# Patient Record
Sex: Female | Born: 1965 | Race: Black or African American | Hispanic: No | Marital: Single | Smoking: Never smoker
Health system: Southern US, Community
[De-identification: ages and names within clinical notes are randomized; demographics above are authoritative.]

## PROBLEM LIST (undated history)

## (undated) DIAGNOSIS — I10 Essential (primary) hypertension: Secondary | ICD-10-CM

---

## 2008-03-03 ENCOUNTER — Emergency Department (HOSPITAL_BASED_OUTPATIENT_CLINIC_OR_DEPARTMENT_OTHER): Admission: EM | Admit: 2008-03-03 | Discharge: 2008-03-03 | Payer: Self-pay | Admitting: Emergency Medicine

## 2008-04-27 ENCOUNTER — Ambulatory Visit (HOSPITAL_COMMUNITY): Admission: RE | Admit: 2008-04-27 | Discharge: 2008-04-27 | Payer: Self-pay | Admitting: Obstetrics and Gynecology

## 2008-05-02 ENCOUNTER — Encounter: Admission: RE | Admit: 2008-05-02 | Discharge: 2008-05-02 | Payer: Self-pay | Admitting: Obstetrics & Gynecology

## 2008-05-02 ENCOUNTER — Ambulatory Visit: Payer: Self-pay | Admitting: Obstetrics & Gynecology

## 2008-05-09 ENCOUNTER — Ambulatory Visit: Payer: Self-pay | Admitting: Obstetrics & Gynecology

## 2008-05-23 ENCOUNTER — Ambulatory Visit: Payer: Self-pay | Admitting: Obstetrics & Gynecology

## 2008-06-02 ENCOUNTER — Ambulatory Visit (HOSPITAL_COMMUNITY): Admission: RE | Admit: 2008-06-02 | Discharge: 2008-06-02 | Payer: Self-pay | Admitting: Family Medicine

## 2008-06-06 ENCOUNTER — Encounter: Payer: Self-pay | Admitting: Obstetrics & Gynecology

## 2008-06-06 ENCOUNTER — Ambulatory Visit: Payer: Self-pay | Admitting: Family Medicine

## 2008-06-20 ENCOUNTER — Ambulatory Visit: Payer: Self-pay | Admitting: Obstetrics & Gynecology

## 2008-07-04 ENCOUNTER — Ambulatory Visit: Payer: Self-pay | Admitting: Obstetrics & Gynecology

## 2008-07-18 ENCOUNTER — Ambulatory Visit: Payer: Self-pay | Admitting: Family Medicine

## 2008-07-19 ENCOUNTER — Ambulatory Visit (HOSPITAL_COMMUNITY): Admission: RE | Admit: 2008-07-19 | Discharge: 2008-07-19 | Payer: Self-pay | Admitting: Family Medicine

## 2008-08-01 ENCOUNTER — Ambulatory Visit: Payer: Self-pay | Admitting: Family Medicine

## 2008-08-01 ENCOUNTER — Encounter: Payer: Self-pay | Admitting: Obstetrics & Gynecology

## 2008-08-01 LAB — CONVERTED CEMR LAB
Antibody Screen: NEGATIVE
HCT: 35.8 % — ABNORMAL LOW (ref 36.0–46.0)
MCV: 88 fL (ref 78.0–100.0)
Platelets: 323 10*3/uL (ref 150–400)
RBC: 4.07 M/uL (ref 3.87–5.11)

## 2008-08-08 ENCOUNTER — Encounter: Payer: Self-pay | Admitting: Obstetrics & Gynecology

## 2008-08-08 ENCOUNTER — Ambulatory Visit: Payer: Self-pay | Admitting: Obstetrics & Gynecology

## 2008-08-08 LAB — CONVERTED CEMR LAB
AST: 12 units/L (ref 0–37)
BUN: 12 mg/dL (ref 6–23)
CO2: 19 meq/L (ref 19–32)
Calcium: 8.8 mg/dL (ref 8.4–10.5)
Chloride: 104 meq/L (ref 96–112)
Collection Interval-CRCL: 24 hr
Creatinine 24 HR UR: 1841 mg/24hr — ABNORMAL HIGH (ref 700–1800)
Creatinine, Ser: 0.71 mg/dL (ref 0.40–1.20)
Creatinine, Urine: 105.2 mg/dL
Potassium: 3.9 meq/L (ref 3.5–5.3)
Protein, Ur: 245 mg/24hr — ABNORMAL HIGH (ref 50–100)
Uric Acid, Serum: 3.7 mg/dL (ref 2.4–7.0)

## 2008-08-16 ENCOUNTER — Ambulatory Visit (HOSPITAL_COMMUNITY): Admission: RE | Admit: 2008-08-16 | Discharge: 2008-08-16 | Payer: Self-pay | Admitting: Family Medicine

## 2008-08-22 ENCOUNTER — Ambulatory Visit: Payer: Self-pay | Admitting: Obstetrics & Gynecology

## 2008-08-29 ENCOUNTER — Ambulatory Visit (HOSPITAL_COMMUNITY): Admission: RE | Admit: 2008-08-29 | Discharge: 2008-08-29 | Payer: Self-pay | Admitting: Family Medicine

## 2008-08-29 ENCOUNTER — Ambulatory Visit: Payer: Self-pay | Admitting: Family Medicine

## 2008-09-01 ENCOUNTER — Ambulatory Visit: Payer: Self-pay | Admitting: Obstetrics & Gynecology

## 2008-09-05 ENCOUNTER — Encounter: Admission: RE | Admit: 2008-09-05 | Discharge: 2008-09-05 | Payer: Self-pay | Admitting: Obstetrics & Gynecology

## 2008-09-05 ENCOUNTER — Ambulatory Visit: Payer: Self-pay | Admitting: Family Medicine

## 2008-09-05 ENCOUNTER — Encounter: Payer: Self-pay | Admitting: Obstetrics & Gynecology

## 2008-09-05 LAB — CONVERTED CEMR LAB
Trich, Wet Prep: NONE SEEN
Yeast Wet Prep HPF POC: NONE SEEN

## 2008-09-06 ENCOUNTER — Encounter: Payer: Self-pay | Admitting: Obstetrics & Gynecology

## 2008-09-06 LAB — CONVERTED CEMR LAB: GC Probe Amp, Genital: NEGATIVE

## 2008-09-08 ENCOUNTER — Ambulatory Visit: Payer: Self-pay | Admitting: Obstetrics & Gynecology

## 2008-09-12 ENCOUNTER — Ambulatory Visit: Payer: Self-pay | Admitting: Obstetrics & Gynecology

## 2008-09-15 ENCOUNTER — Ambulatory Visit: Payer: Self-pay | Admitting: Family Medicine

## 2008-09-19 ENCOUNTER — Ambulatory Visit: Payer: Self-pay | Admitting: Obstetrics & Gynecology

## 2008-09-20 ENCOUNTER — Ambulatory Visit (HOSPITAL_COMMUNITY): Admission: RE | Admit: 2008-09-20 | Discharge: 2008-09-20 | Payer: Self-pay | Admitting: Family Medicine

## 2008-09-22 ENCOUNTER — Ambulatory Visit: Payer: Self-pay | Admitting: Obstetrics & Gynecology

## 2008-09-26 ENCOUNTER — Ambulatory Visit: Payer: Self-pay | Admitting: Obstetrics & Gynecology

## 2008-09-26 ENCOUNTER — Encounter: Payer: Self-pay | Admitting: Obstetrics & Gynecology

## 2008-09-26 LAB — CONVERTED CEMR LAB
AST: 18 units/L (ref 0–37)
Albumin: 2.7 g/dL — ABNORMAL LOW (ref 3.5–5.2)
Chloride: 101 meq/L (ref 96–112)
HCT: 35 % — ABNORMAL LOW (ref 36.0–46.0)
Hemoglobin: 11.8 g/dL — ABNORMAL LOW (ref 12.0–15.0)
MCHC: 33.5 g/dL (ref 30.0–36.0)
MCV: 88.6 fL (ref 78.0–100.0)
Platelets: 270 10*3/uL (ref 150–400)
Potassium: 4 meq/L (ref 3.5–5.3)
Sodium: 130 meq/L — ABNORMAL LOW (ref 135–145)
Total Bilirubin: 0.1 mg/dL — ABNORMAL LOW (ref 0.3–1.2)
Total Protein: 6.4 g/dL (ref 6.0–8.3)
Uric Acid, Serum: 4.1 mg/dL (ref 2.4–7.0)

## 2008-09-28 ENCOUNTER — Ambulatory Visit: Payer: Self-pay | Admitting: Obstetrics & Gynecology

## 2008-09-28 ENCOUNTER — Encounter: Payer: Self-pay | Admitting: Obstetrics & Gynecology

## 2008-09-28 LAB — CONVERTED CEMR LAB
Collection Interval-CRCL: 24 hr
Creatinine 24 HR UR: 1874 mg/24hr — ABNORMAL HIGH (ref 700–1800)
Creatinine Clearance: 186 mL/min — ABNORMAL HIGH (ref 75–115)
Creatinine, Urine: 81.5 mg/dL

## 2008-10-03 ENCOUNTER — Ambulatory Visit: Payer: Self-pay | Admitting: Family Medicine

## 2008-10-03 ENCOUNTER — Ambulatory Visit: Payer: Self-pay | Admitting: Obstetrics & Gynecology

## 2008-10-03 ENCOUNTER — Inpatient Hospital Stay (HOSPITAL_COMMUNITY): Admission: AD | Admit: 2008-10-03 | Discharge: 2008-10-08 | Payer: Self-pay | Admitting: Family Medicine

## 2008-10-21 ENCOUNTER — Ambulatory Visit: Payer: Self-pay | Admitting: Obstetrics & Gynecology

## 2010-09-09 ENCOUNTER — Encounter: Payer: Self-pay | Admitting: *Deleted

## 2010-09-12 IMAGING — US US OB FOLLOW-UP
1 series · 14 of 28 positions shown · non-contrast
Comparison: none

OBSTETRICAL ULTRASOUND:
 This ultrasound was performed in The [HOSPITAL], and the [facility] report will be stored to [REDACTED] PACS.

[Series 1: us ob follow-up · 14 of 42 slices shown]
[im 2/42]
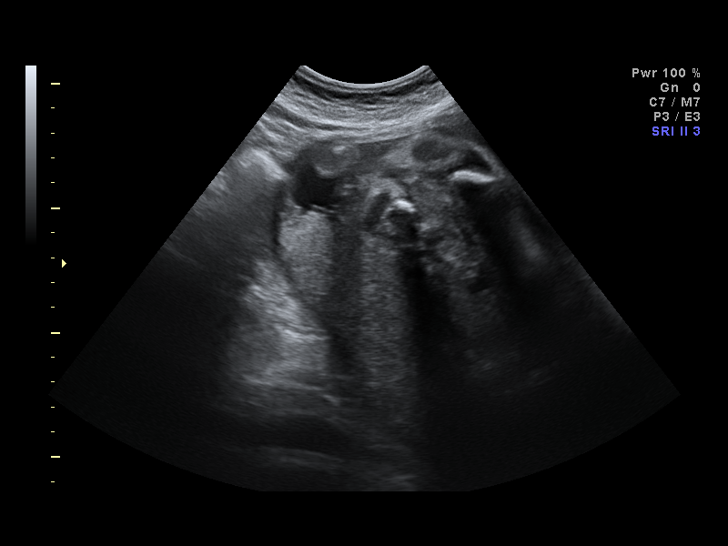
[im 5/42]
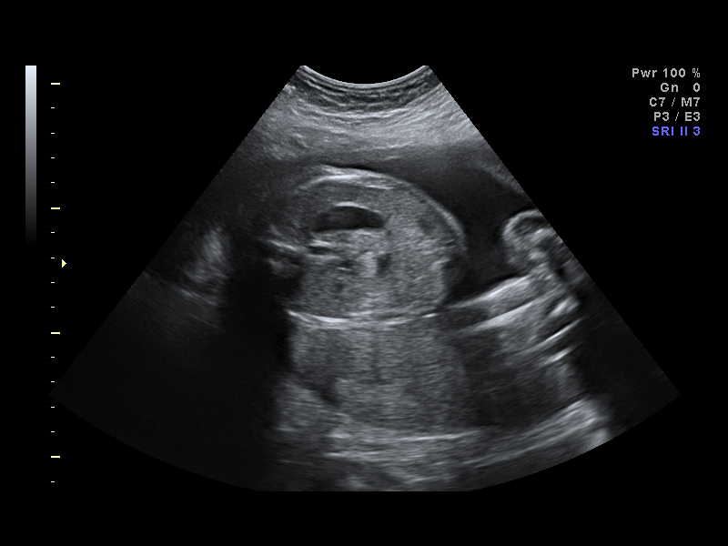
[im 8/42]
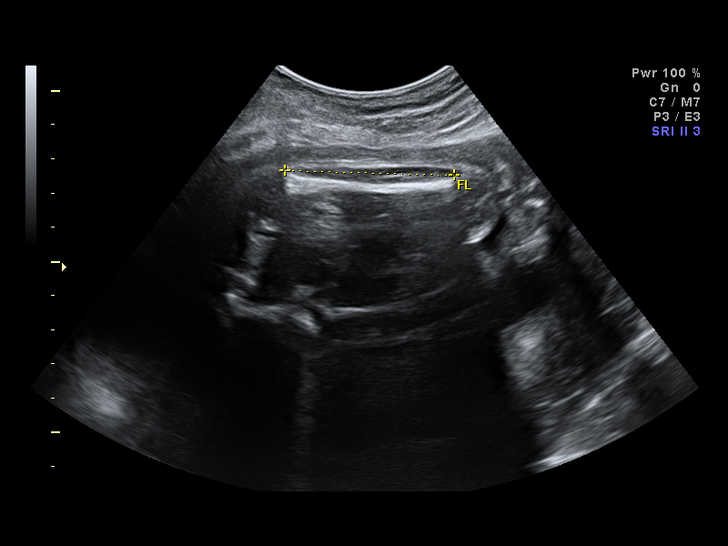
[im 11/42]
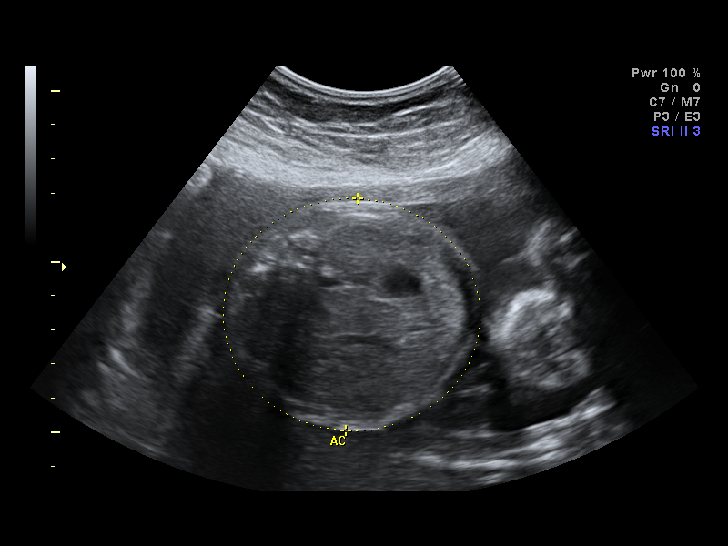
[im 14/42]
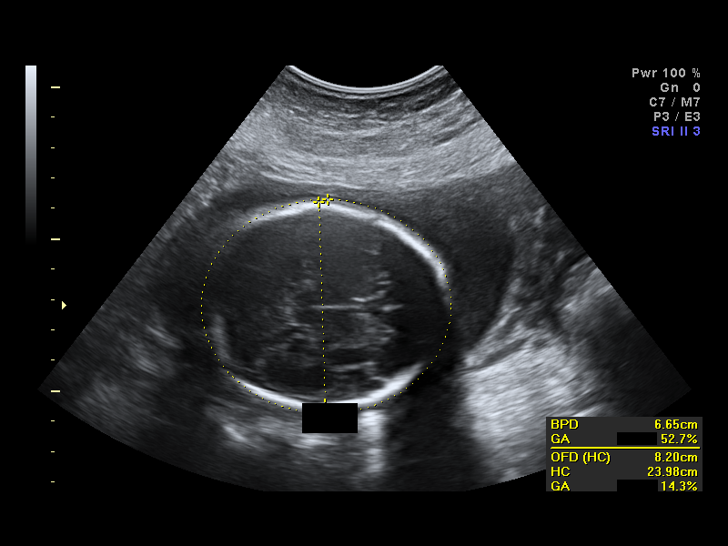
[im 17/42]
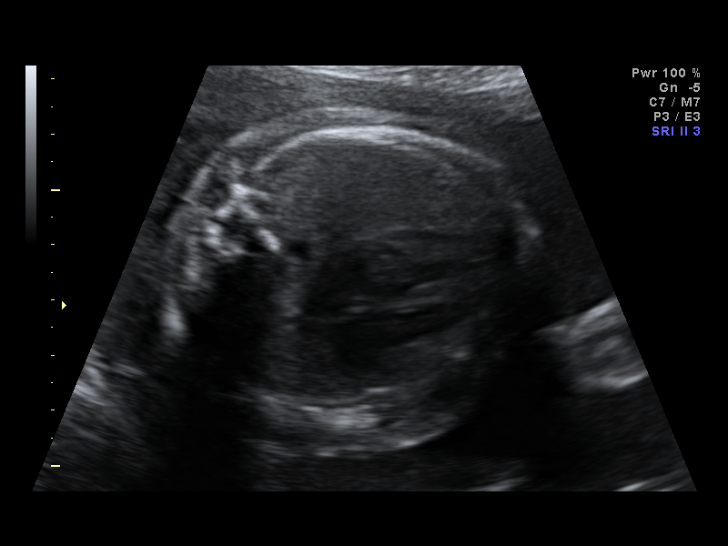
[im 20/42]
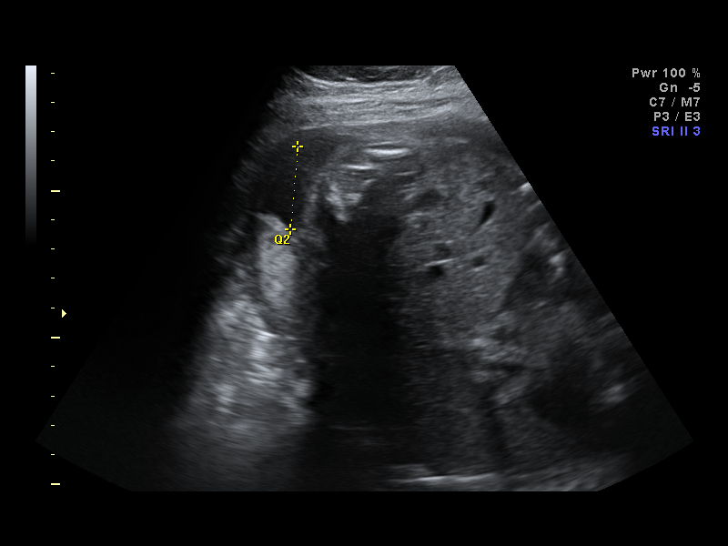
[im 23/42]
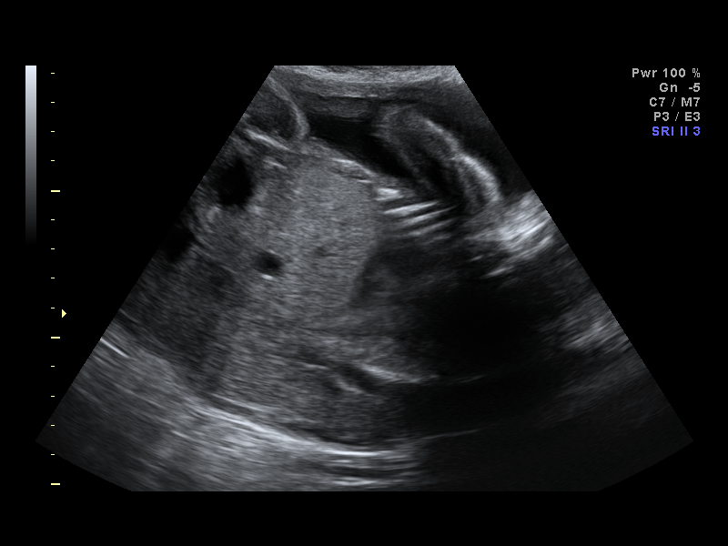
[im 26/42]
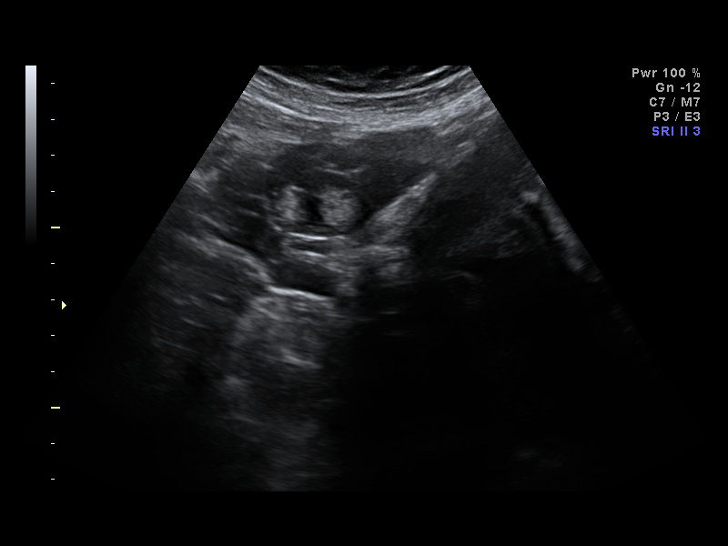
[im 29/42]
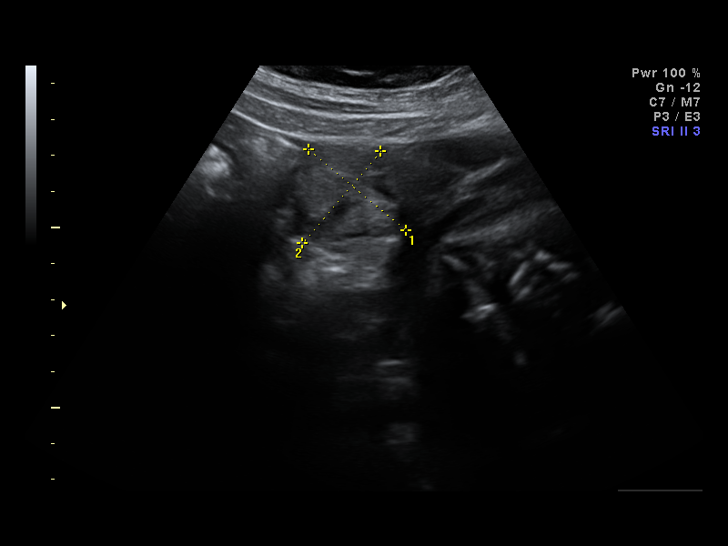
[im 32/42]
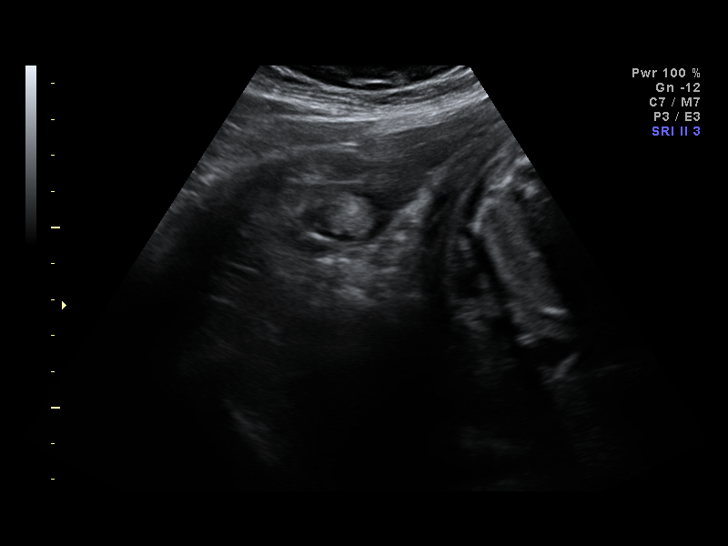
[im 35/42]
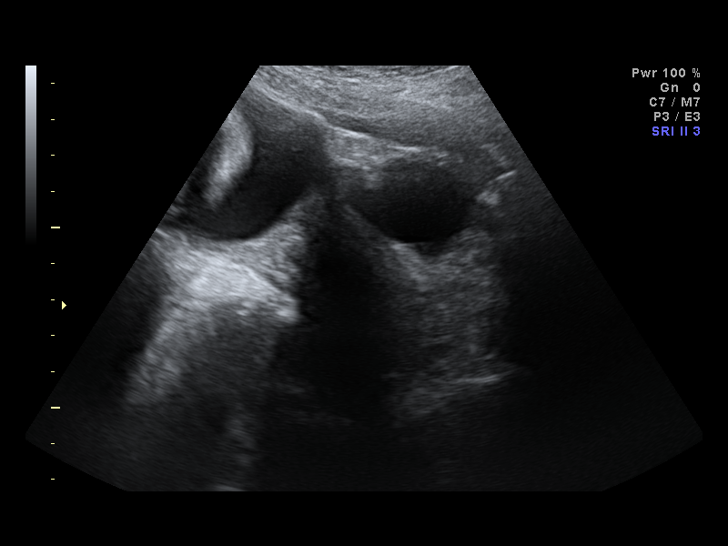
[im 38/42]
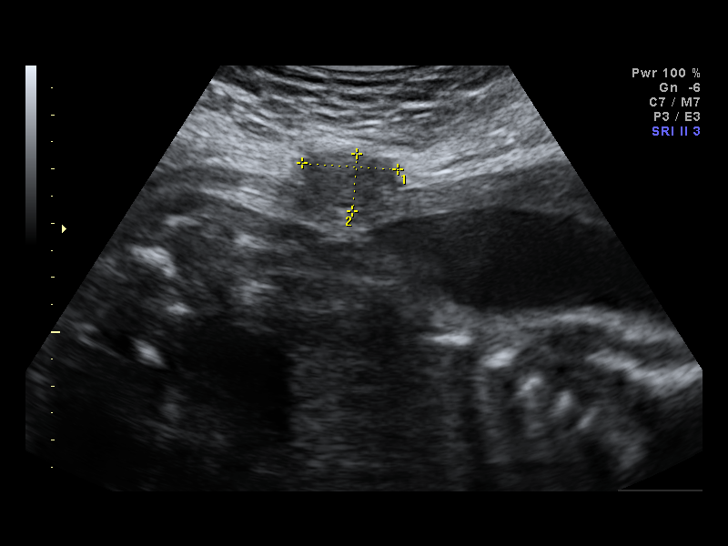
[im 42/42]
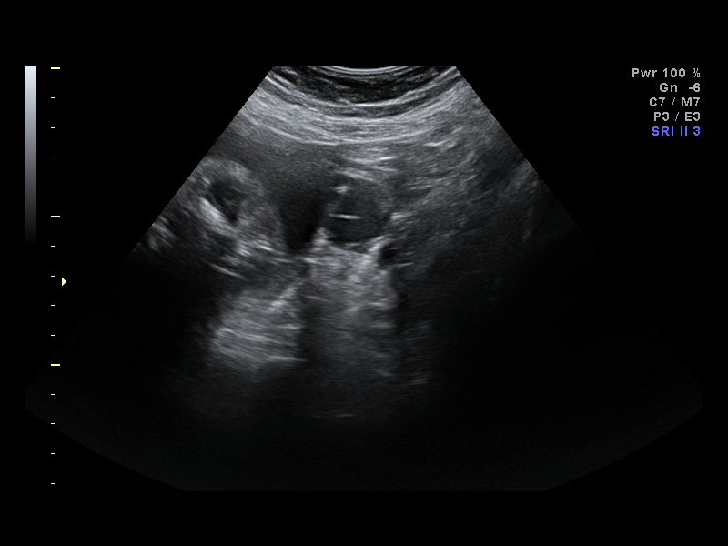

[14 of 28 positions shown; findings below may reference images not displayed]

IMPRESSION: [facility] has also been faxed to the ordering physician.

## 2010-10-10 IMAGING — US US OB FOLLOW-UP
1 series · 18 of 28 positions shown · non-contrast
Comparison: none

OBSTETRICAL ULTRASOUND:
 This ultrasound was performed in The [facility]OSP[facility]], and the [facility] report will be stored to [REDACTED] PACS.

[Series 1: us ob follow-up · 62 acquisitions, 18 frames shown]
[im 1/62]
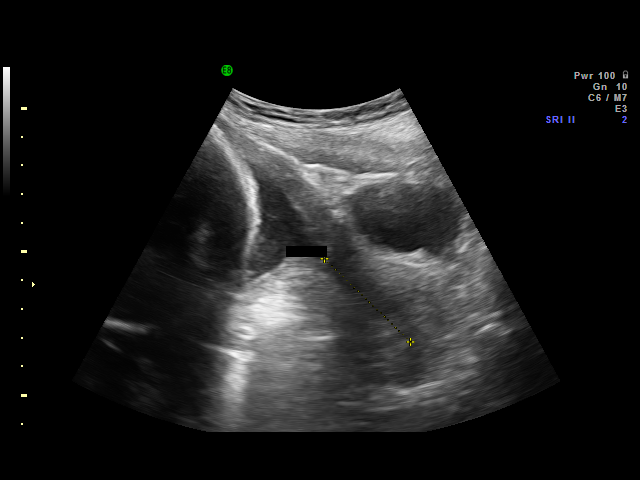
[im 5/62]
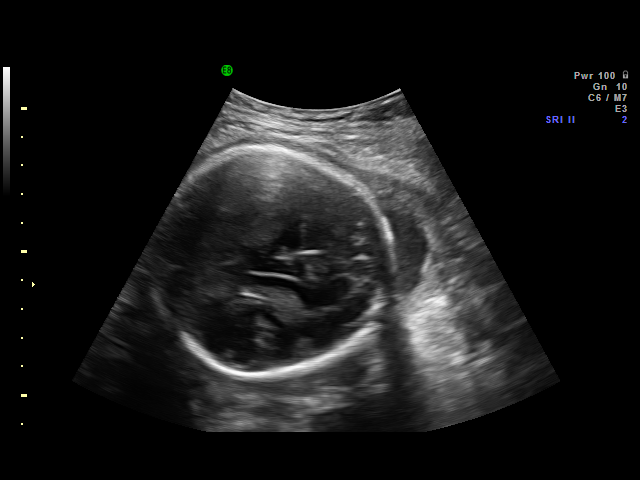
[im 7/62]
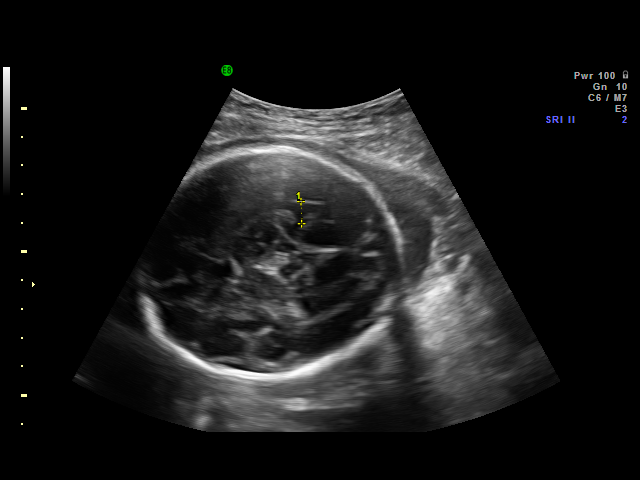
[im 12/62]
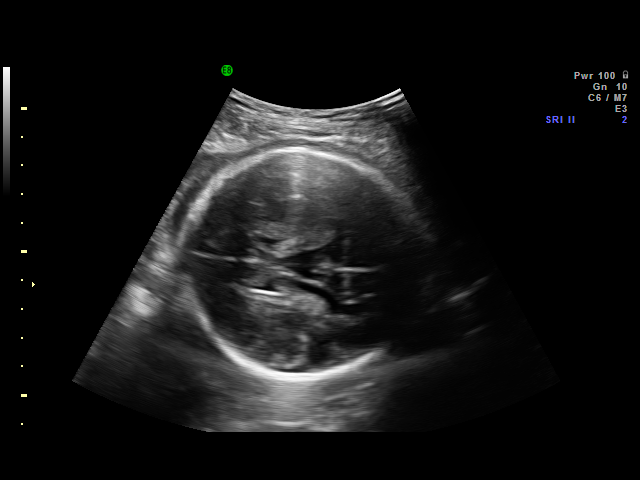
[im 16/62]
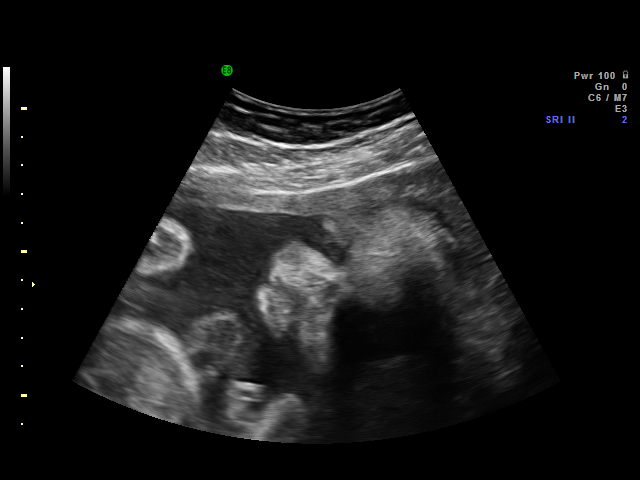
[im 19/62]
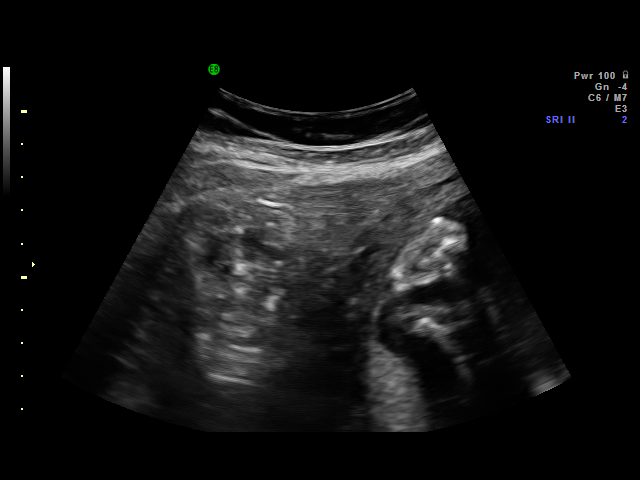
[im 23/62]
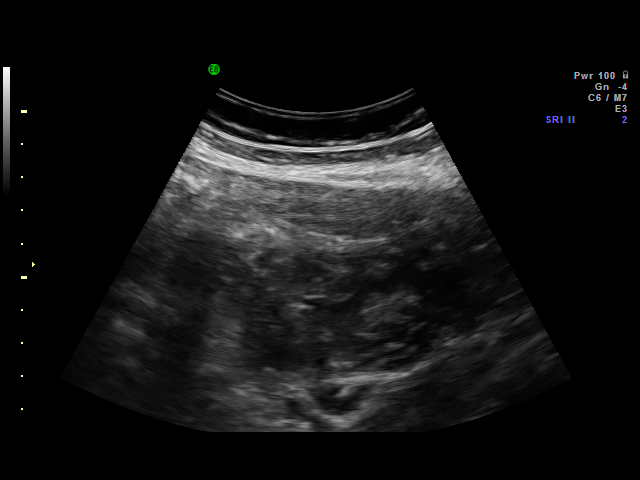
[im 25/62]
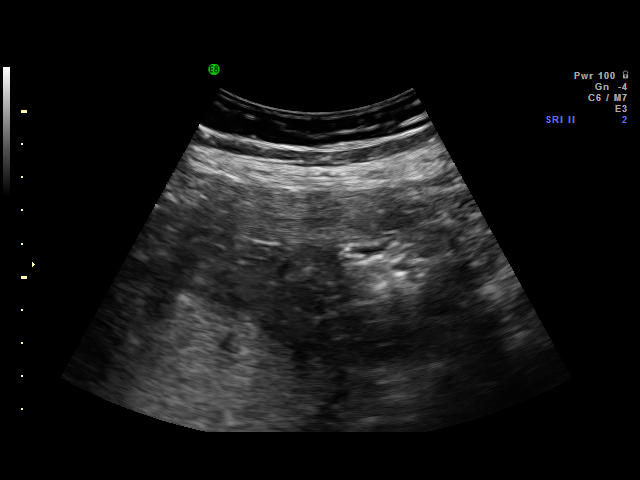
[im 30/62]
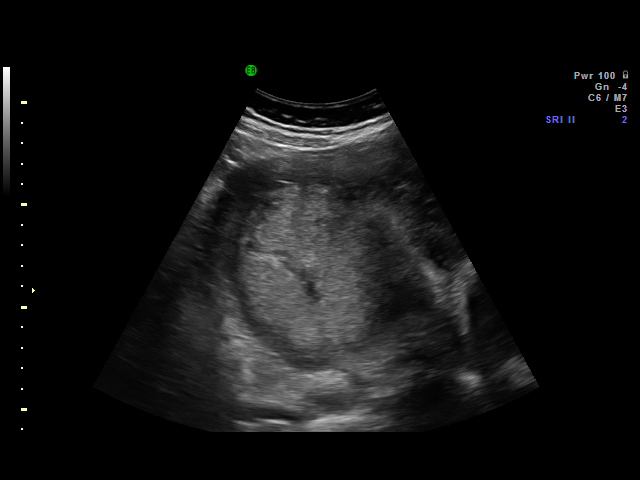
[im 32/62]
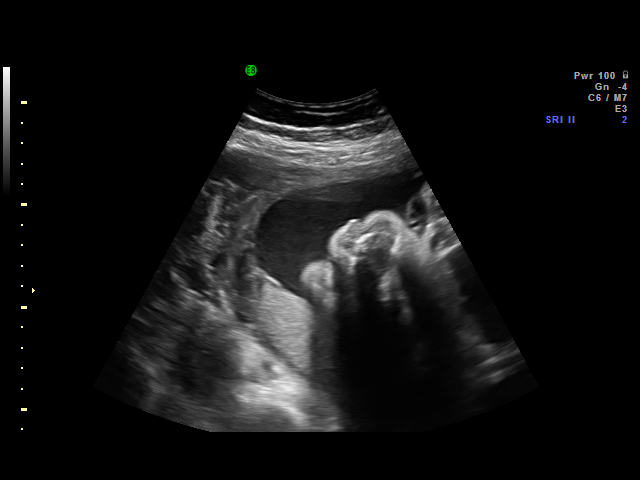
[im 37/62]
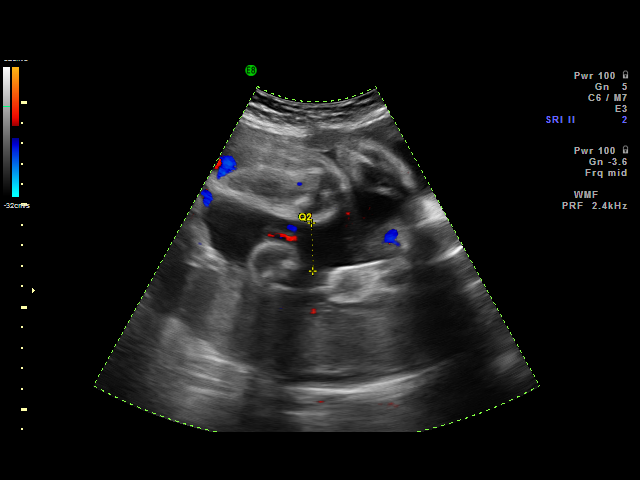
[im 39/62]
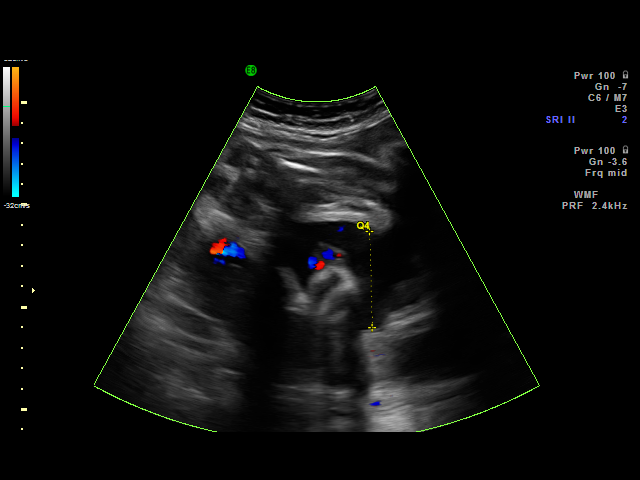
[im 43/62]
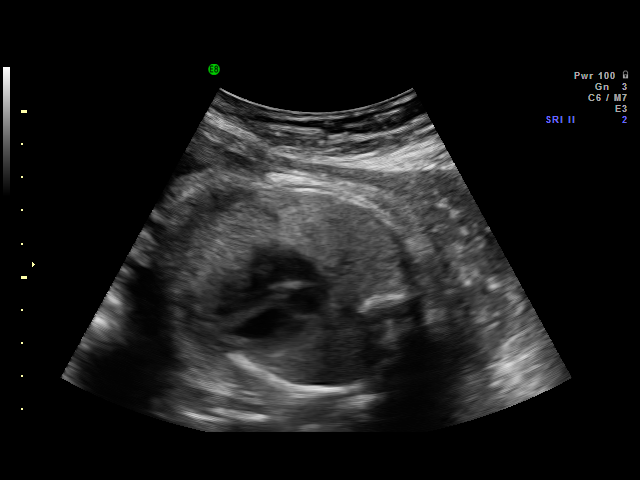
[im 48/62]
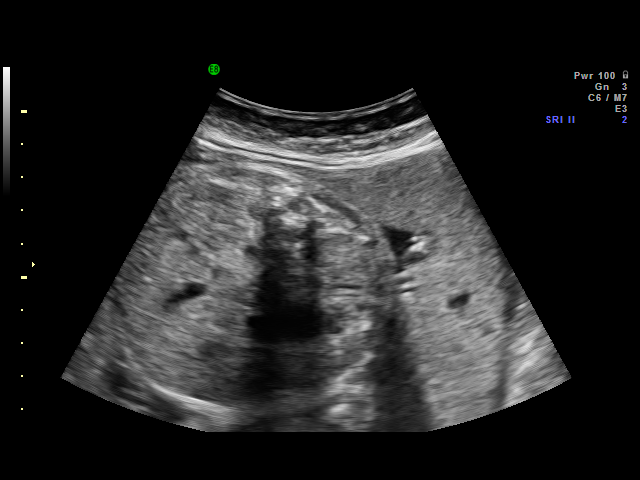
[im 50/62]
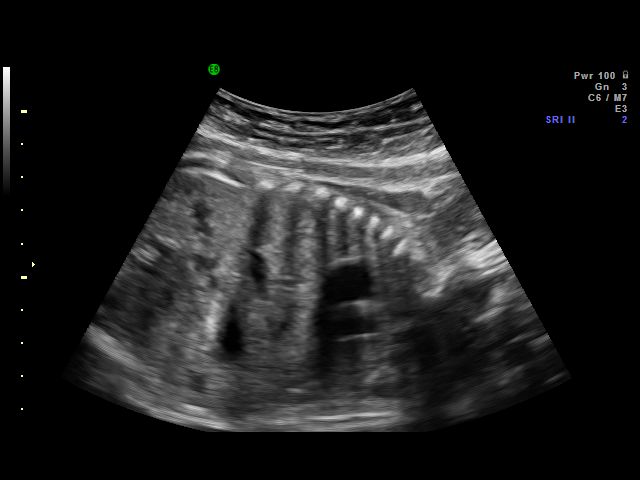
[im 55/62]
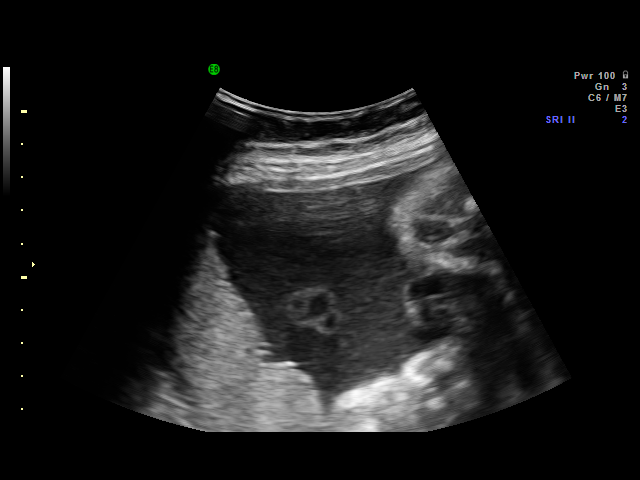
[im 57/62]
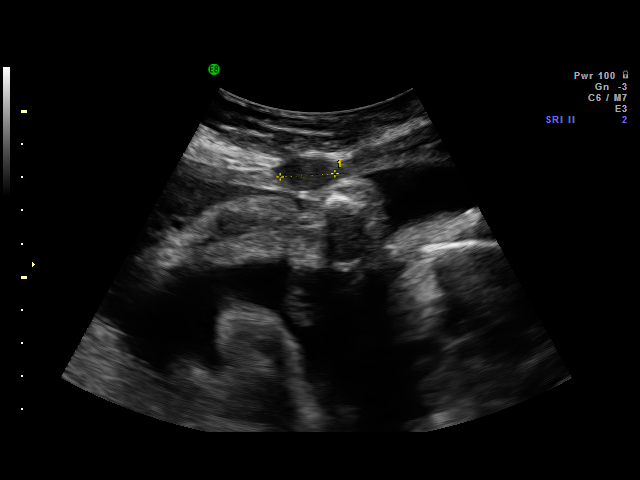
[im 62/62]
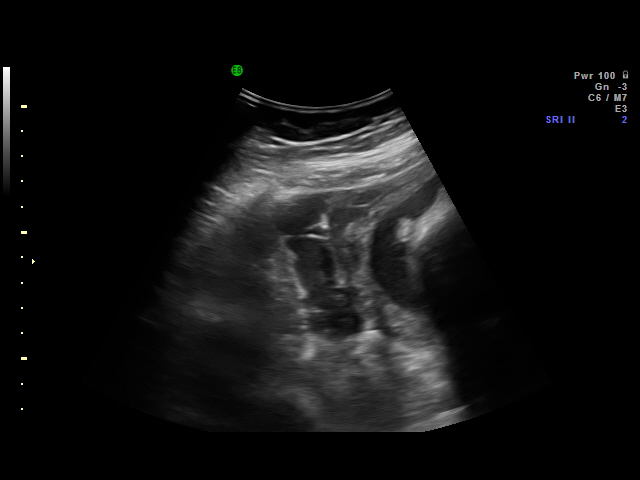

[18 of 28 positions shown; findings below may reference images not displayed]

IMPRESSION: [facility] has also been faxed to the ordering physician.

## 2010-10-23 IMAGING — US US OB FOLLOW-UP
1 series · 14 of 28 positions shown · non-contrast
Comparison: none

OBSTETRICAL ULTRASOUND:
 This ultrasound was performed in The [facility]], and the [facility] report will be stored to [REDACTED] PACS.

[Series 1: us ob follow-up · 14 of 54 slices shown]
[im 2/54]
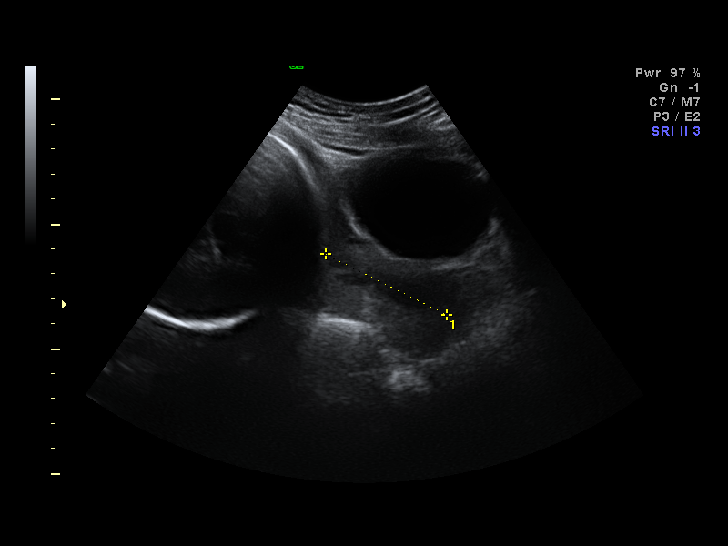
[im 6/54]
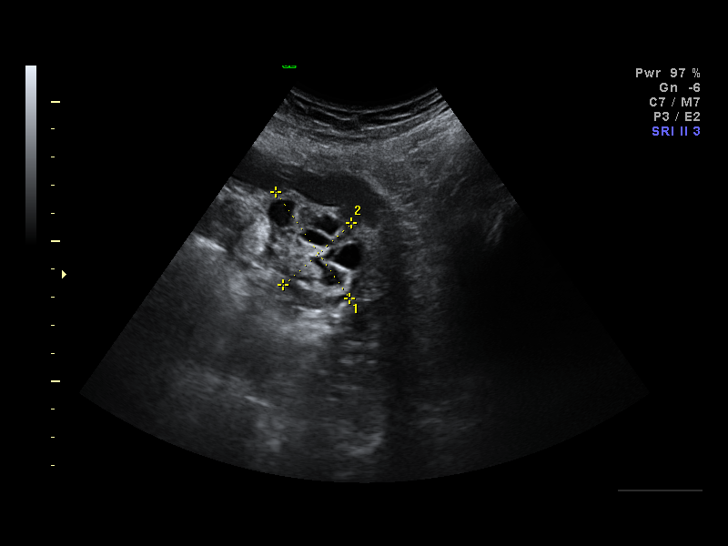
[im 10/54]
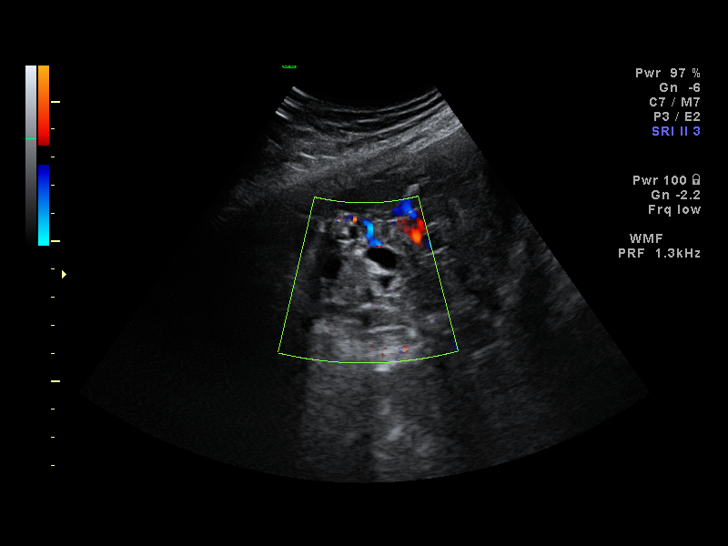
[im 14/54]
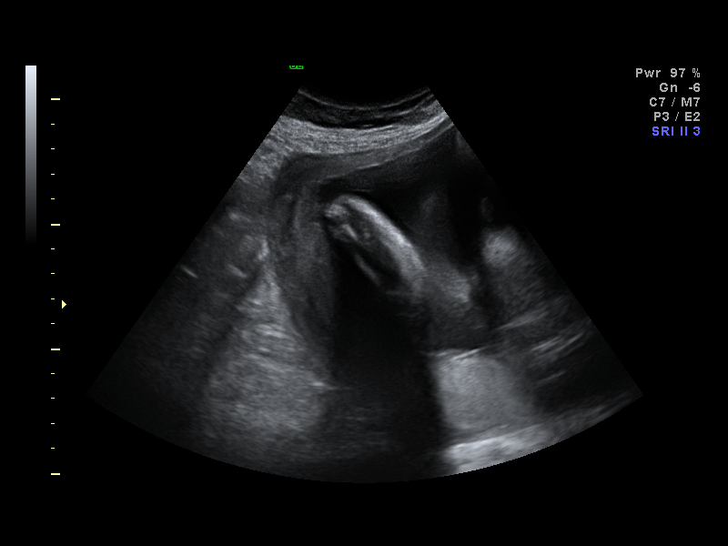
[im 18/54]
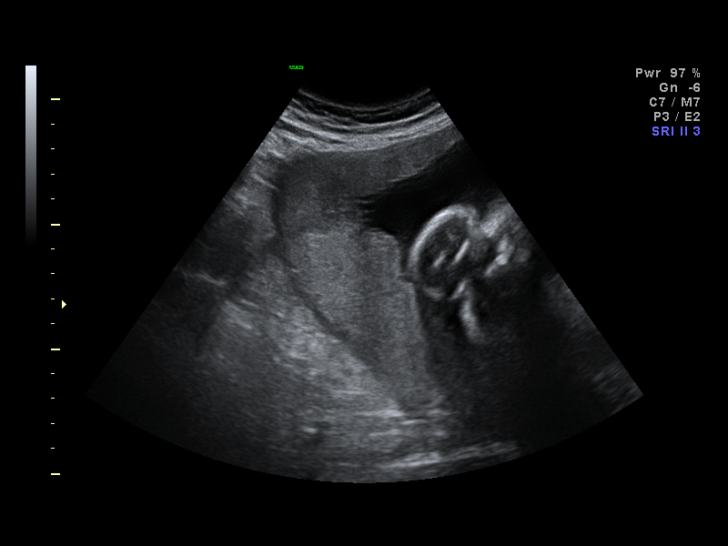
[im 22/54]
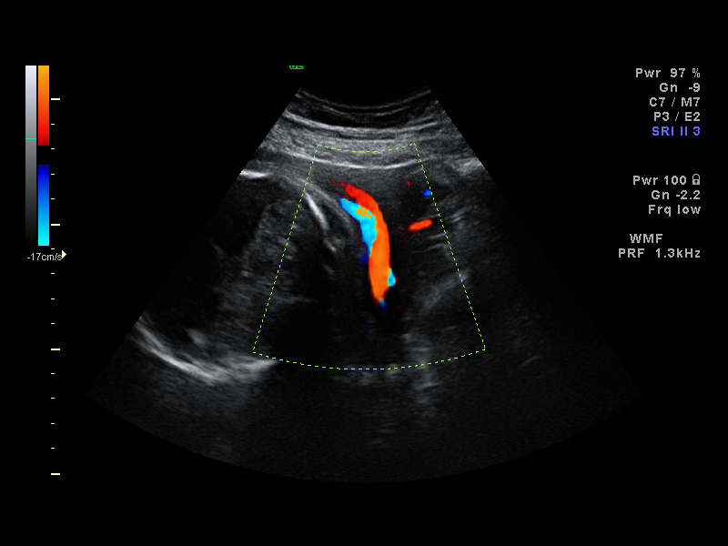
[im 26/54]
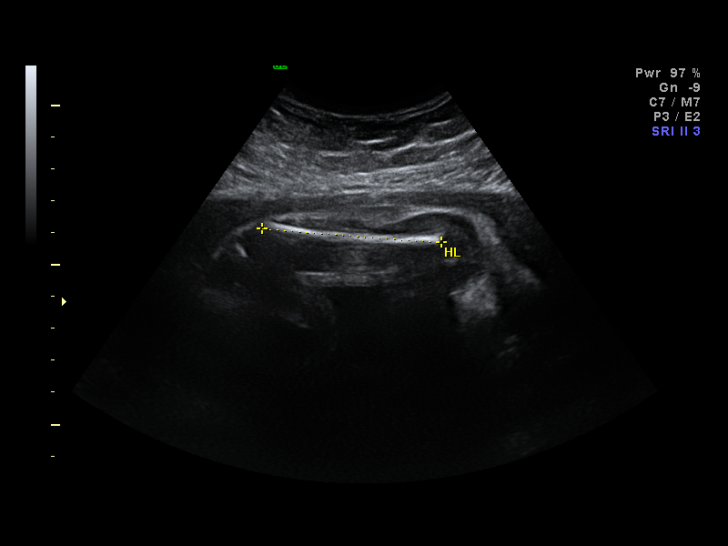
[im 30/54]
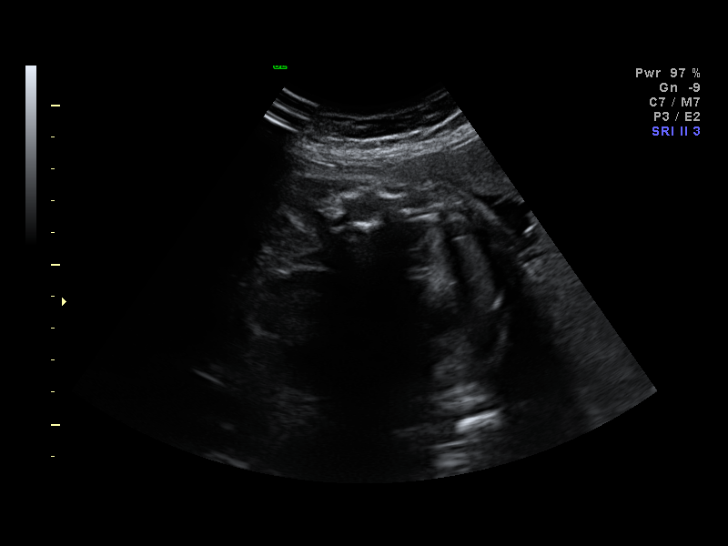
[im 34/54]
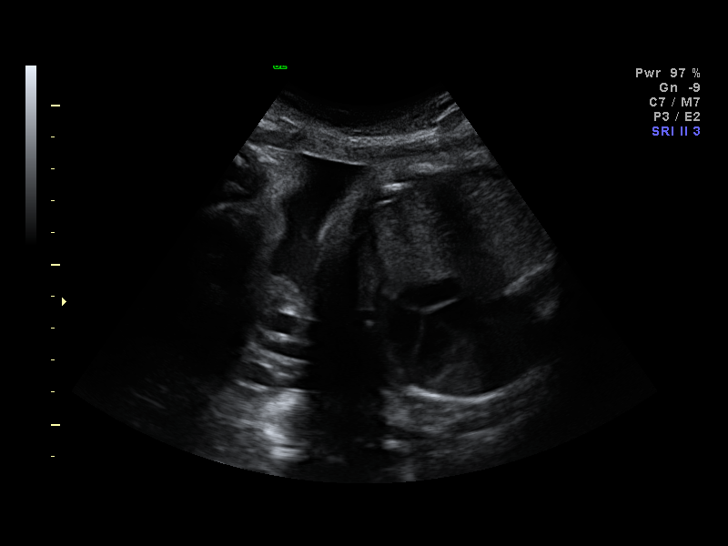
[im 38/54]
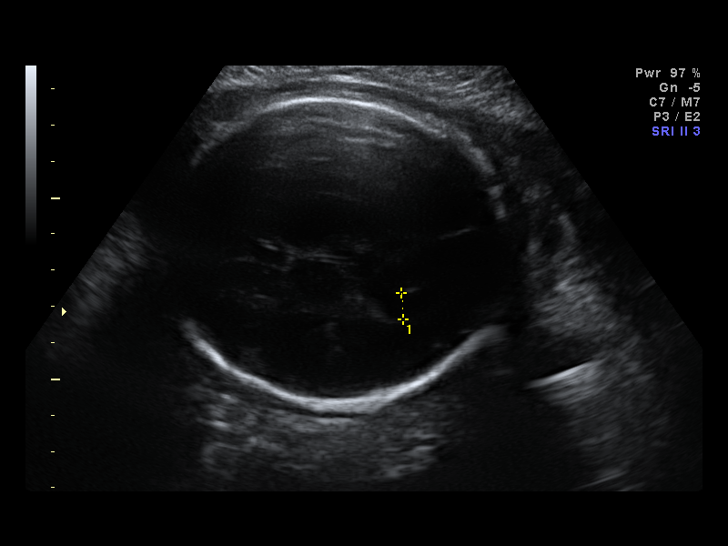
[im 42/54]
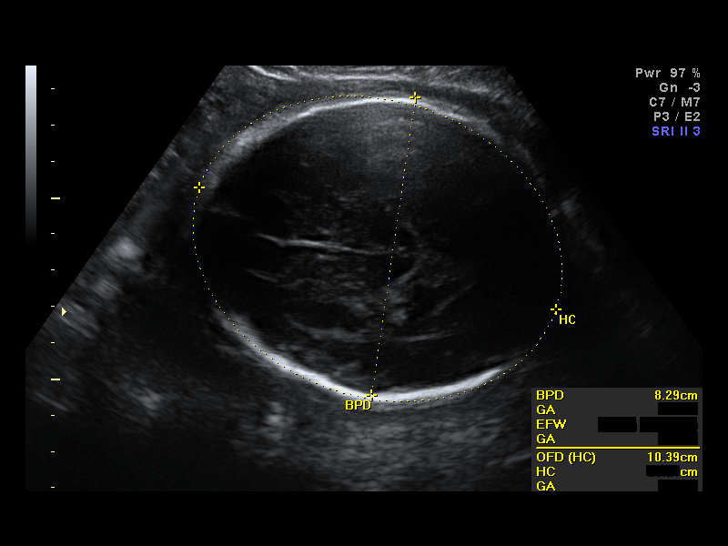
[im 46/54]
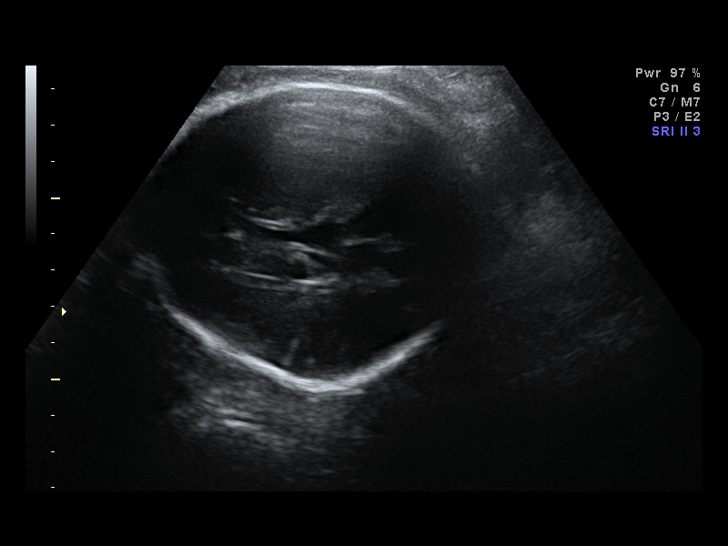
[im 50/54]
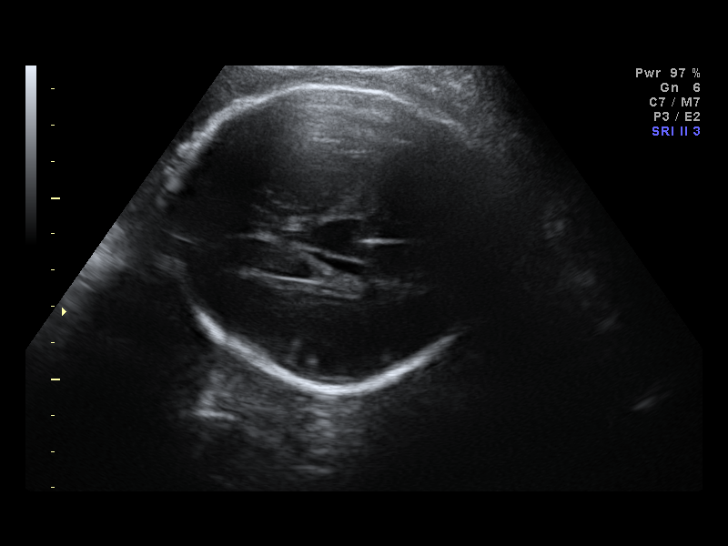
[im 54/54]
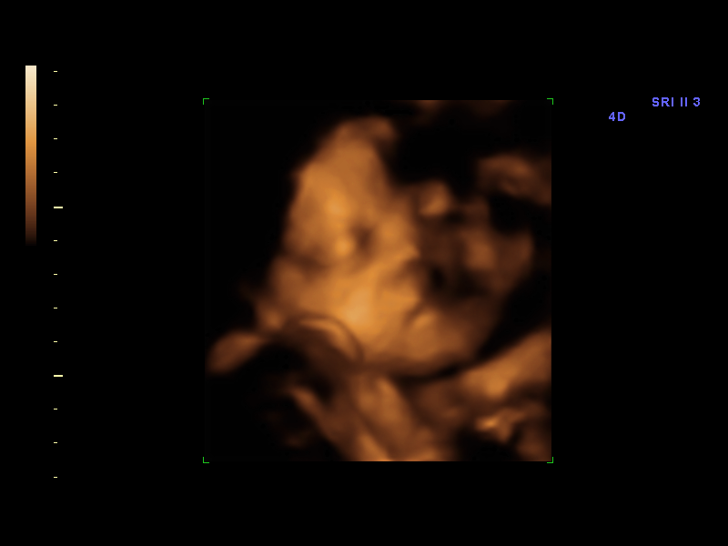

[14 of 28 positions shown; findings below may reference images not displayed]

IMPRESSION: [facility] has also been faxed to the ordering physician.

## 2010-12-03 LAB — POCT URINALYSIS DIP (DEVICE)
Bilirubin Urine: NEGATIVE
Glucose, UA: NEGATIVE mg/dL
Hgb urine dipstick: NEGATIVE
Hgb urine dipstick: NEGATIVE
Nitrite: NEGATIVE
Nitrite: NEGATIVE
Protein, ur: 100 mg/dL — AB
Protein, ur: 100 mg/dL — AB
Specific Gravity, Urine: 1.025 (ref 1.005–1.030)
Specific Gravity, Urine: >=1.03 (ref 1.005–1.030)
Urobilinogen, UA: 0.2 mg/dL (ref 0.0–1.0)
Urobilinogen, UA: 0.2 mg/dL (ref 0.0–1.0)
Urobilinogen, UA: 0.2 mg/dL (ref 0.0–1.0)
pH: 6.5 (ref 5.0–8.0)

## 2010-12-04 LAB — GLUCOSE, CAPILLARY
Glucose-Capillary: 86 mg/dL (ref 70–99)
Glucose-Capillary: 81 mg/dL (ref 70–99)
Glucose-Capillary: 82 mg/dL (ref 70–99)
Glucose-Capillary: 90 mg/dL (ref 70–99)
Glucose-Capillary: 94 mg/dL (ref 70–99)

## 2010-12-04 LAB — BASIC METABOLIC PANEL
BUN: 8 mg/dL (ref 6–23)
BUN: 9 mg/dL (ref 6–23)
CO2: 22 mEq/L (ref 19–32)
CO2: 24 mEq/L (ref 19–32)
CO2: 25 mEq/L (ref 19–32)
Chloride: 101 mEq/L (ref 96–112)
Chloride: 110 mEq/L (ref 96–112)
Chloride: 97 mEq/L (ref 96–112)
Creatinine, Ser: 1.07 mg/dL (ref 0.4–1.2)
Creatinine, Ser: 1.11 mg/dL (ref 0.4–1.2)
GFR calc Af Amer: >60 mL/min (ref >60)
GFR calc Af Amer: >60 mL/min (ref >60)
Glucose, Bld: 99 mg/dL (ref 70–99)
Potassium: 3.6 mEq/L (ref 3.5–5.1)
Potassium: 4.1 mEq/L (ref 3.5–5.1)
Sodium: 138 mEq/L (ref 135–145)

## 2010-12-04 LAB — POCT URINALYSIS DIP (DEVICE)
Bilirubin Urine: NEGATIVE
Bilirubin Urine: NEGATIVE
Glucose, UA: NEGATIVE mg/dL
Hgb urine dipstick: NEGATIVE
Protein, ur: >=300 mg/dL — AB
Specific Gravity, Urine: >=1.03 (ref 1.005–1.030)
Specific Gravity, Urine: >=1.03 (ref 1.005–1.030)
Specific Gravity, Urine: >=1.03 (ref 1.005–1.030)
Urobilinogen, UA: 0.2 mg/dL (ref 0.0–1.0)
Urobilinogen, UA: 0.2 mg/dL (ref 0.0–1.0)
Urobilinogen, UA: 0.2 mg/dL (ref 0.0–1.0)
pH: 6 (ref 5.0–8.0)
pH: 6 (ref 5.0–8.0)

## 2010-12-04 LAB — CBC
HCT: 35.1 % — ABNORMAL LOW (ref 36.0–46.0)
HCT: 21.3 % — ABNORMAL LOW (ref 36.0–46.0)
HCT: 22.1 % — ABNORMAL LOW (ref 36.0–46.0)
Hemoglobin: 11.7 g/dL — ABNORMAL LOW (ref 12.0–15.0)
Hemoglobin: 7.3 g/dL — CL (ref 12.0–15.0)
MCHC: 33.2 g/dL (ref 30.0–36.0)
MCHC: 33.2 g/dL (ref 30.0–36.0)
MCHC: 34.3 g/dL (ref 30.0–36.0)
MCV: 87.9 fL (ref 78.0–100.0)
MCV: 88.4 fL (ref 78.0–100.0)
MCV: 88.5 fL (ref 78.0–100.0)
Platelets: 203 10*3/uL (ref 150–400)
Platelets: 248 10*3/uL (ref 150–400)
RBC: 2.41 MIL/uL — ABNORMAL LOW (ref 3.87–5.11)
RBC: 2.51 MIL/uL — ABNORMAL LOW (ref 3.87–5.11)
RBC: 3.95 MIL/uL (ref 3.87–5.11)
RDW: 13.4 % (ref 11.5–15.5)
WBC: 8.7 10*3/uL (ref 4.0–10.5)
WBC: 12.7 10*3/uL — ABNORMAL HIGH (ref 4.0–10.5)
WBC: 14.7 10*3/uL — ABNORMAL HIGH (ref 4.0–10.5)

## 2010-12-04 LAB — COMPREHENSIVE METABOLIC PANEL
AST: 20 U/L (ref 0–37)
Albumin: 2.7 g/dL — ABNORMAL LOW (ref 3.5–5.2)
BUN: 7 mg/dL (ref 6–23)
Chloride: 105 mEq/L (ref 96–112)
Creatinine, Ser: 0.77 mg/dL (ref 0.4–1.2)
Glucose, Bld: 87 mg/dL (ref 70–99)
Potassium: 4 mEq/L (ref 3.5–5.1)
Total Bilirubin: 0.3 mg/dL (ref 0.3–1.2)

## 2010-12-04 LAB — CROSSMATCH: Antibody Screen: NEGATIVE

## 2010-12-04 LAB — RH IMMUNE GLOB WKUP(>/=20WKS)(NOT WOMEN'S HOSP)

## 2010-12-04 LAB — ABO/RH: ABO/RH(D): O NEG

## 2011-01-01 NOTE — Discharge Summary (Signed)
Kayla Goodman, Kayla Goodman               ACCOUNT NO.:  [id number]   MEDICAL RECORD NO.:  [id number]         PATIENT TYPE:  WINP   LOCATION:                                FACILITY:  [facility]   PHYSICIAN:  Norton Blizzard, MD    DATE OF BIRTH:  [birth year]   DATE OF ADMISSION:  10/03/2008  DATE OF DISCHARGE:                               DISCHARGE SUMMARY   REASON FOR HOSPITALIZATION:  The patient was admitted at 39 weeks for  induction of labor secondary to chronic hypertension and diet-controlled  gestational diabetes.   PERTINENT LABORATORY DATA:  Upon admission, the patient's hemoglobin was  11.7.  RPR nonreactive.   FINAL DIAGNOSES:  1. Primary low-transverse cesarean section secondary to failure to      progress.  2. Normotensive.  3. Chorioamnionitis.  4. Anemia.   PROCEDURES PERFORMED:  Cesarean section, which resulted in a female  infant delivered on October 05, 2008, weight 6 pounds 2 ounces, 20-1/2  inches, Apgars of 9 and 9.   SIGNIFICANT FINDINGS:  The patient was admitted and began Cytotec  induction, later progressed to artificial rupture of membranes and then  Pitocin augmentation. The patient was also started on magnesium sulfate  IV due to severe range elevated blood pressures.  The patient did not  progress past 5 cm  despite adequate contractions, at which time it was  recommended that a C-section be performed.  In addition to the previous  stated management, the patient was also begun on Gentamicin and  Clindamycin for a T-max of 101.1 after delivery, and was on this therapy  for 24 hours.   CONDITION ON DISCHARGE:  The patient's condition is stable, no further  fevers. Blood pressure is 109/73 and temperature 98.2.  White blood cell  count 12.7.  The patient is ambulating without difficulty and pain well  controlled with medications.  Routine postpartum and postoperative  instructions were given to the patient and her family.  The patient is  to follow up in 5-7  days for staple removal.  The patient is to limit  lifting anything greater than 10 pounds.  A prescription was given for  Ibuprofen and Percocet as needed for pain as well as ferrous sulfate for  the anemia.  Hemoglobin at discharge is 7.3, but asymptomatic.  The  patient is also to follow up in one week for blood pressure check.     Sid Falcon, CNM      Norton Blizzard, MD  Electronically Signed   WM/MEDQ  D:  10/08/2008  T:  10/08/2008  Job:  [id number]

## 2011-01-01 NOTE — Op Note (Signed)
Kayla Goodman, Kayla Goodman               ACCOUNT NO.:  [id number]   MEDICAL RECORD NO.:  [id number]          PATIENT TYPE:  INP   LOCATION:                                FACILITY:  [facility]   PHYSICIAN:  Scheryl Darter, MD       DATE OF BIRTH:  12/31/1965   DATE OF PROCEDURE:  10/04/2008  DATE OF DISCHARGE:  10/08/2008                               OPERATIVE REPORT   PREOPERATIVE DIAGNOSES:  1. Failure to progress.  2. Term intrauterine pregnancy.  3. Advanced maternal age.  4. Gestational diabetes mellitus, type A1.  5. Preeclampsia.   POSTOPERATIVE DIAGNOSES:  1. Failure to progress.  2. Term intrauterine pregnancy.  3. Advanced maternal age.  4. Gestational diabetes mellitus, type A1.  5. Preeclampsia.  6. Nuchal cord.   PROCEDURE:  Primary low transverse cesarean section.   SURGEON:  Scheryl Darter, MD   ASSISTANT:  Odie Sera, DO   ANESTHESIA:  Epidural.   INDICATIONS FOR SURGERY:  [name] is a 45 year old gravida 1,  now para 1-0-0-1, who was admitted at 37-2/7th-week gestational age with  elevated blood pressures, headaches, proteinuria, and diagnosis of  preeclampsia.  She was admitted for induction of labor.  At the start of  her induction, her cervix was fingertip and high.  Her induction  progressed with Cytotec and a Foley bulb, which we managed to get her  cervix to 4 cm.  Pitocin was started and her membranes were ruptured and  her cervix dilated to 5 cm; however, failed to dilate any further than  that.  She was given hours and hours of Pitocin with the Pitocin rate of  36 milliunits per minute.  However, despite the best efforts, an  adequate contraction pattern was never obtainable despite well over 12  hours of higher dosages.  During this time, the cervix did not dilate.  The baby was found to be occiput posterior and progressively more and  more moulding was noted.  At this time, decision was made to do a C-  section for failure to progress.  The  patient was counseled on risks and  benefits of this procedure to include but not limited to bleeding,  infection, damage to internal organs which potentially could require  further surgery as well as potential to have hysterectomy.  The patient  understood these risks and agreed to proceed.   DESCRIPTION OF PROCEDURE:  The patient was taken to operating room where  epidural anesthesia was redosed.  The patient was then prepped and  draped in usual sterile manner and placed in left dorsal supine  position.  A time-out was conducted.  Appropriate anesthesia was  confirmed.  A Pfannenstiel incision was made in the skin and continued  through the subcutaneous layers to the fascia.  The fascia was then  incised in the midline.  The fascial incision was extended laterally  using Mayo scissors.  The fascia was then dissected off the underlying  rectus muscles both bluntly and sharply with the Mayo scissors.  The  rectus muscles were then separated in midline and the  peritoneum was  entered bluntly.  The peritoneal opening was extended with manual  retraction.  Appropriate opening to the uterus was obtained.  The  bladder blade was placed and a transverse incision was made in lower  uterine segment.  The incision was carried through the myometrium with  the scalpel until bulging membranes were noted.  The incision was then  extended laterally with manual traction.  Membranes were ruptured  bluntly and clear amniotic fluid was noted.  The fetal head was grasped  and moderate molding was noted.  The bladder blade was then removed and  the fetal head was delivered with the assistance of fundal pressure.  The mouth and nares were bulb suctioned.  The fetal head presented right  occiput posterior.  A tight nuchal cord was noted, which was manually  reduced.  The shoulders were then delivered followed by rest of the  corpus without any difficulty.  The mouth and nares were bulb suctioned  again.  The  cord was clamped and cut and the baby was handed to the  awaiting NICU staff with a spontaneous cry and good tone.  The placenta  was then removed manually.  It was intact.  There was a 3-vessel cord.  The uterus was cleared of clots and debris in the usual manner.  The  uterine incision was then closed using 0 Vicryl in a running  interlocking fashion.  Some moderate oozing was noted from the left  lateral aspect of the uterine incision and this was controlled using  several figure-of-eight sutures.  Good hemostasis was noted of the  uterine incision.  Both ovaries and fallopian tubes were identified and  found to be grossly normal.  The abdomen was then irrigated with sterile  water.  The peritoneum was then approximated using 0 Vicryl suture.  The  fascia was then closed using 0 Vicryl in a running noninterlocking  manner.  Good hemostasis was noted.  There are no defects noted in the  fascia.  Subcutaneous tissues were irrigated with a wet lap sponge.  Skin was then closed using staples in the usual manner.  A pressure  dressing was applied.   FINDINGS:  1. Clear amniotic fluid.  2. Viable female infant 6 pounds 2 ounces.  3. Bilateral fallopian tubes and ovaries grossly normal.   SPECIMENS:  Placenta.   DISPOSITION:  To Labor and Delivery.   ESTIMATED BLOOD LOSS:  800 mL.   The patient taken to PACU in good condition.  There are no immediate  complications.      Odie Sera, DO      Scheryl Darter, MD  Electronically Signed    MC/MEDQ  D:  10/05/2008  T:  10/05/2008  Job:  [id number]

## 2011-05-20 LAB — POCT URINALYSIS DIP (DEVICE)
Hgb urine dipstick: NEGATIVE
Ketones, ur: >=160 — AB
Operator id: 194561
Protein, ur: 100 — AB
Specific Gravity, Urine: 1.025
Urobilinogen, UA: 2 — ABNORMAL HIGH

## 2011-05-21 LAB — POCT URINALYSIS DIP (DEVICE)
Bilirubin Urine: NEGATIVE
Glucose, UA: NEGATIVE
Glucose, UA: 100 — AB
Glucose, UA: NEGATIVE
Glucose, UA: NEGATIVE
Hgb urine dipstick: NEGATIVE
Hgb urine dipstick: NEGATIVE
Hgb urine dipstick: NEGATIVE
Hgb urine dipstick: NEGATIVE
Hgb urine dipstick: NEGATIVE
Ketones, ur: 15 — AB
Ketones, ur: 15 — AB
Ketones, ur: NEGATIVE
Nitrite: NEGATIVE
Operator id: 135281
Operator id: 297281
Operator id: 148111
Operator id: 297281
Protein, ur: 100 — AB
Protein, ur: 100 — AB
Specific Gravity, Urine: >=1.03
Specific Gravity, Urine: >=1.03
Specific Gravity, Urine: 1.025
Specific Gravity, Urine: <=1.005
Specific Gravity, Urine: >=1.03
Urobilinogen, UA: 0.2
pH: 5.5

## 2011-05-22 LAB — POCT URINALYSIS DIP (DEVICE)
Glucose, UA: NEGATIVE
Ketones, ur: >=160 — AB
Nitrite: NEGATIVE
Specific Gravity, Urine: >=1.03

## 2011-05-24 LAB — POCT URINALYSIS DIP (DEVICE)
Bilirubin Urine: NEGATIVE
Hgb urine dipstick: NEGATIVE
Ketones, ur: NEGATIVE mg/dL
Ketones, ur: NEGATIVE mg/dL
Protein, ur: 100 mg/dL — AB
Specific Gravity, Urine: 1.015 (ref 1.005–1.030)
Specific Gravity, Urine: 1.02 (ref 1.005–1.030)
pH: 6.5 (ref 5.0–8.0)

## 2016-03-26 ENCOUNTER — Encounter (HOSPITAL_BASED_OUTPATIENT_CLINIC_OR_DEPARTMENT_OTHER): Payer: Self-pay | Admitting: Emergency Medicine

## 2016-03-26 ENCOUNTER — Emergency Department (HOSPITAL_BASED_OUTPATIENT_CLINIC_OR_DEPARTMENT_OTHER)
Admission: EM | Admit: 2016-03-26 | Discharge: 2016-03-26 | Disposition: A | Discharge status: Home / Self Care | Payer: BLUE CROSS/BLUE SHIELD | Attending: Emergency Medicine | Admitting: Emergency Medicine

## 2016-03-26 DIAGNOSIS — I10 Essential (primary) hypertension: Secondary | ICD-10-CM | POA: Insufficient documentation

## 2016-03-26 DIAGNOSIS — R51 Headache: Secondary | ICD-10-CM

## 2016-03-26 DIAGNOSIS — R519 Headache, unspecified: Secondary | ICD-10-CM

## 2016-03-26 DIAGNOSIS — M25512 Pain in left shoulder: Secondary | ICD-10-CM | POA: Insufficient documentation

## 2016-03-26 DIAGNOSIS — Z79899 Other long term (current) drug therapy: Secondary | ICD-10-CM | POA: Diagnosis not present

## 2016-03-26 DIAGNOSIS — G43909 Migraine, unspecified, not intractable, without status migrainosus: Secondary | ICD-10-CM | POA: Diagnosis present

## 2016-03-26 HISTORY — DX: Essential (primary) hypertension: I10

## 2016-03-26 LAB — COMPREHENSIVE METABOLIC PANEL
ALK PHOS: 53 U/L (ref 38–126)
ALT: 20 U/L (ref 14–54)
AST: 25 U/L (ref 15–41)
Albumin: 4.2 g/dL (ref 3.5–5.0)
Anion gap: 9 (ref 5–15)
BILIRUBIN TOTAL: 0.7 mg/dL (ref 0.3–1.2)
BUN: 13 mg/dL (ref 6–20)
CALCIUM: 9.3 mg/dL (ref 8.9–10.3)
CHLORIDE: 103 mmol/L (ref 101–111)
CO2: 25 mmol/L (ref 22–32)
CREATININE: 1.02 mg/dL — AB (ref 0.44–1.00)
Glucose, Bld: 101 mg/dL — ABNORMAL HIGH (ref 65–99)
Potassium: 3.3 mmol/L — ABNORMAL LOW (ref 3.5–5.1)
Sodium: 137 mmol/L (ref 135–145)
TOTAL PROTEIN: 8 g/dL (ref 6.5–8.1)

## 2016-03-26 LAB — URINALYSIS, ROUTINE W REFLEX MICROSCOPIC
BILIRUBIN URINE: NEGATIVE
GLUCOSE, UA: NEGATIVE mg/dL
HGB URINE DIPSTICK: NEGATIVE
Ketones, ur: NEGATIVE mg/dL
Leukocytes, UA: NEGATIVE
Nitrite: NEGATIVE
PH: 6.5 (ref 5.0–8.0)
Protein, ur: NEGATIVE mg/dL
SPECIFIC GRAVITY, URINE: 1.022 (ref 1.005–1.030)

## 2016-03-26 LAB — CBC WITH DIFFERENTIAL/PLATELET
Basophils Absolute: 0 10*3/uL (ref 0.0–0.1)
Basophils Relative: 0 %
EOS ABS: 0.1 10*3/uL (ref 0.0–0.7)
Eosinophils Relative: 2 %
HCT: 42 % (ref 36.0–46.0)
HEMOGLOBIN: 14.3 g/dL (ref 12.0–15.0)
LYMPHS ABS: 3.8 10*3/uL (ref 0.7–4.0)
LYMPHS PCT: 46 %
MCH: 28.4 pg (ref 26.0–34.0)
MCHC: 34 g/dL (ref 30.0–36.0)
MCV: 83.5 fL (ref 78.0–100.0)
MONOS PCT: 9 %
Monocytes Absolute: 0.7 10*3/uL (ref 0.1–1.0)
NEUTROS PCT: 43 %
Neutro Abs: 3.5 10*3/uL (ref 1.7–7.7)
Platelets: 310 10*3/uL (ref 150–400)
RBC: 5.03 MIL/uL (ref 3.87–5.11)
RDW: 12.9 % (ref 11.5–15.5)
WBC: 8.2 10*3/uL (ref 4.0–10.5)

## 2016-03-26 LAB — PREGNANCY, URINE: PREG TEST UR: NEGATIVE

## 2016-03-26 MED ORDER — HYDROCHLOROTHIAZIDE 25 MG PO TABS
25.0000 mg | ORAL_TABLET | Freq: Every day | ORAL | Status: DC
  Administered 2016-03-26: 25 mg via ORAL
  Filled 2016-03-26: qty 1

## 2016-03-26 NOTE — ED Provider Notes (Signed)
[facility] Note   CSN: [id number] Arrival date & time: 03/26/16  1142  First Provider Contact:  None       History   Chief Complaint Chief Complaint  Patient presents with  . Migraine  . Shoulder Pain    HPI Kayla Goodman is a 50 y.o. female who presents for headaches and lightheadedness. Patient is a poor historian. History is limited by the mental capacity of the patient, patient's ability to communicate effectively, and overall poor insight. The headaches have been ongoing for several months. They come and go. Improve with ibuprofen and rest. They tend to be aching and global. She has associated BL shoulder pain. Denies photophobia, phonophobia, UL throbbing, N/V, visual changes, stiff neck, neck pain, rash, or "thunderclap" onset. Denies unilateral weakness, facial asymmetry, difficulty with speech, change in gait, or vertigo. She has also noticed some occasional dizziness with standing. She does not have vertigo. She is on HCTZ, but is only intermittently compliant. Denies fevers, chills, myalgias, arthralgias. Denies DOE, SOB, chest tightness or pressure, radiation to left arm, jaw or back, or diaphoresis. Denies dysuria, flank pain, suprapubic pain, frequency, urgency, or hematuria.  Denies abdominal pain, nausea, vomiting, diarrhea or constipation.     HPI  Past Medical History:  Diagnosis Date  . Hypertension     There are no active problems to display for this patient.   Past Surgical History:  Procedure Laterality Date  . CESAREAN SECTION      OB History    No data available       Home Medications    Prior to Admission medications   Medication Sig Start Date End Date Taking? Authorizing Provider  hydrochlorothiazide (HYDRODIURIL) 25 MG tablet Take 25 mg by mouth daily.   Yes Historical Provider, MD    Family History No family history on file.  Social History Social History  Substance Use Topics  . Smoking status: Never  Smoker  . Smokeless tobacco: Never Used  . Alcohol use No     Allergies   Penicillins   Review of Systems Review of Systems  Ten systems reviewed and are negative for acute change, except as noted in the HPI.   Physical Exam Updated Vital Signs BP 121/71   Pulse 73   Temp 98.8 F (37.1 C)   Resp 16   Ht 5\' 2"  (1.575 m)   Wt 81.6 kg   LMP 02/27/2016   SpO2 98%   BMI 32.92 kg/m   Physical Exam  Physical Exam  Nursing note and vitals reviewed. Constitutional: She is oriented to person, place, and time. She appears well-developed and well-nourished. No distress.  HENT:  Head: Normocephalic and atraumatic.  Eyes: Conjunctivae normal and EOM are normal. Pupils are equal, round, and reactive to light. No scleral icterus.  Neck: Normal range of motion.  Cardiovascular: Normal rate, regular rhythm and normal heart sounds.  Exam reveals no gallop and no friction rub.   No murmur heard. Pulmonary/Chest: Effort normal and breath sounds normal. No respiratory distress.  Abdominal: Soft. Bowel sounds are normal. She exhibits no distension and no mass. There is no tenderness. There is no guarding.  Neurological: Speech is clear and goal oriented, follows commands Major Cranial nerves without deficit, no facial droop Normal strength in upper and lower extremities bilaterally including dorsiflexion and plantar flexion, strong and equal grip strength Sensation normal to light and sharp touch Moves extremities without ataxia, coordination intact Normal finger to nose and rapid alternating  movements Neg romberg, no pronator drift Normal gait Normal heel-shin and balance Skin: Skin is warm and dry. She is not diaphoretic.    ED Treatments / Results  Labs (all labs ordered are listed, but only abnormal results are displayed) Labs Reviewed  URINALYSIS, ROUTINE W REFLEX MICROSCOPIC (NOT AT [facility]) - Abnormal; Notable for the following:       Result Value   APPearance CLOUDY (*)     All other components within normal limits  COMPREHENSIVE METABOLIC PANEL - Abnormal; Notable for the following:    Potassium 3.3 (*)    Glucose, Bld 101 (*)    Creatinine, Ser 1.02 (*)    All other components within normal limits  CBC WITH DIFFERENTIAL/PLATELET  PREGNANCY, URINE    EKG  EKG Interpretation  Date/Time:  Tuesday March 26 2016 12:01:27 EDT Ventricular Rate:  88 PR Interval:    QRS Duration: 93 QT Interval:  372 QTC Calculation: 451 R Axis:   60 Text Interpretation:  Sinus rhythm Baseline wander in lead(s) II III aVF V3 No previous ECGs available Confirmed by YAO  MD, DAVID ([id number]) on 03/26/2016 1:12:35 PM       Radiology No results found.  Procedures Procedures (including critical care time)  Medications Ordered in ED Medications  hydrochlorothiazide (HYDRODIURIL) tablet 25 mg (25 mg Oral Given 03/26/16 1236)     Initial Impression / Assessment and Plan / ED Course  I have reviewed the triage vital signs and the nursing notes.  Pertinent labs & imaging results that were available during my care of the patient were reviewed by me and considered in my medical decision making (see chart for details).  Clinical Course   Patient with intermittent headaches that improve with over-the-counter analgesics. Her blood pressure is also elevated responded very well to her daily dose of hydrochlorothiazide 25 mg was patient has not been taking. She has no concerning symptoms for subarachnoid hemorrhage, temporal arteritis, meningitis, or other emergent headache. The patient does not have a headache today and a normal neurologic examination. Labs reviewed, and no sign of acute kidney injury, worsening creatinine. Patient is advised to follow up with her primary care physician regarding her headaches and hypertension management. Discussed return precautions with the patient. She is safe for discharge at this time  MDM Number of Diagnoses or Management Options Essential  hypertension:  Frequent headaches:     Final Clinical Impressions(s) / ED Diagnoses   Final diagnoses:  Essential hypertension  Frequent headaches    New Prescriptions Discharge Medication List as of 03/26/2016  2:04 PM       Arthor Captain, PA-C 03/26/16 1606    Charlynne Pander, MD 03/27/16 [phone]

## 2016-03-26 NOTE — ED Triage Notes (Signed)
Pt reports intermittent headaches for a few weeks with associated dizziness. Pt also reports L shoulder pain today. Denies injury. Endorses nausea, no vomiting. Denies visual disturbances.

## 2016-03-26 NOTE — Discharge Instructions (Signed)
Follow up soon with your doctor for management of your blood pressure and headaches. If your headache is bad take motrin, tylenol, or excedrin migraine. You are having a headache. No specific cause was found today for your headache. It may have been a migraine or other cause of headache. Stress, anxiety, fatigue, and depression are common triggers for headaches. Your headache today does not appear to be life-threatening or require hospitalization, but often the exact cause of headaches is not determined in the emergency department. Therefore, follow-up with your doctor is very important to find out what may have caused your headache, and whether or not you need any further diagnostic testing or treatment. Sometimes headaches can appear benign (not harmful), but then more serious symptoms can develop which should prompt an immediate re-evaluation by your doctor or the emergency department. SEEK MEDICAL ATTENTION IF: You develop possible problems with medications prescribed.  The medications don't resolve your headache, if it recurs , or if you have multiple episodes of vomiting or can't take fluids. You have a change from the usual headache. RETURN IMMEDIATELY IF you develop a sudden, severe headache or confusion, become poorly responsive or faint, develop a fever above 100.49F or problem breathing, have a change in speech, vision, swallowing, or understanding, or develop new weakness, numbness, tingling, incoordination, or have a seizure.

## 2017-01-12 ENCOUNTER — Encounter (HOSPITAL_BASED_OUTPATIENT_CLINIC_OR_DEPARTMENT_OTHER): Payer: Self-pay | Admitting: Emergency Medicine

## 2017-01-12 ENCOUNTER — Emergency Department (HOSPITAL_BASED_OUTPATIENT_CLINIC_OR_DEPARTMENT_OTHER): Payer: BLUE CROSS/BLUE SHIELD

## 2017-01-12 ENCOUNTER — Emergency Department (HOSPITAL_BASED_OUTPATIENT_CLINIC_OR_DEPARTMENT_OTHER)
Admission: EM | Admit: 2017-01-12 | Discharge: 2017-01-12 | Disposition: A | Discharge status: Home / Self Care | Payer: BLUE CROSS/BLUE SHIELD | Attending: Physician Assistant | Admitting: Physician Assistant

## 2017-01-12 DIAGNOSIS — G43909 Migraine, unspecified, not intractable, without status migrainosus: Secondary | ICD-10-CM | POA: Diagnosis present

## 2017-01-12 DIAGNOSIS — Z79899 Other long term (current) drug therapy: Secondary | ICD-10-CM | POA: Insufficient documentation

## 2017-01-12 DIAGNOSIS — I10 Essential (primary) hypertension: Secondary | ICD-10-CM | POA: Insufficient documentation

## 2017-01-12 DIAGNOSIS — G43009 Migraine without aura, not intractable, without status migrainosus: Secondary | ICD-10-CM | POA: Diagnosis not present

## 2017-01-12 LAB — PREGNANCY, URINE: PREG TEST UR: NEGATIVE

## 2017-01-12 MED ORDER — SODIUM CHLORIDE 0.9 % IV BOLUS (SEPSIS)
1000.0000 mL | Freq: Once | INTRAVENOUS | Status: AC
  Administered 2017-01-12: 1000 mL via INTRAVENOUS

## 2017-01-12 MED ORDER — DIPHENHYDRAMINE HCL 50 MG/ML IJ SOLN
25.0000 mg | Freq: Once | INTRAMUSCULAR | Status: AC
  Administered 2017-01-12: 25 mg via INTRAVENOUS
  Filled 2017-01-12: qty 1

## 2017-01-12 MED ORDER — KETOROLAC TROMETHAMINE 30 MG/ML IJ SOLN
15.0000 mg | Freq: Once | INTRAMUSCULAR | Status: AC
  Administered 2017-01-12: 15 mg via INTRAVENOUS
  Filled 2017-01-12: qty 1

## 2017-01-12 MED ORDER — PROCHLORPERAZINE EDISYLATE 5 MG/ML IJ SOLN
10.0000 mg | Freq: Once | INTRAMUSCULAR | Status: AC
  Administered 2017-01-12: 10 mg via INTRAVENOUS
  Filled 2017-01-12: qty 2

## 2017-01-12 NOTE — ED Notes (Signed)
Pt states she is feeling better and ready to go home.

## 2017-01-12 NOTE — ED Triage Notes (Signed)
H/A's x 1 month, hurting worse than usual today

## 2017-01-12 NOTE — Discharge Instructions (Signed)
Please follow-up with the neurologist as needed for continued headaches.

## 2017-01-12 NOTE — ED Notes (Signed)
Patient transported to CT 

## 2017-01-12 NOTE — ED Notes (Signed)
Pt returned from CT °

## 2017-01-12 NOTE — ED Provider Notes (Signed)
[facility]   CSN: [id number] Arrival date & time: 01/12/17  1150     History   Chief Complaint Chief Complaint  Patient presents with  . Headache    HPI Kayla Goodman is a 51 y.o. female.  HPI   Patient is a 51 year old female presenting with headache. Patient's had headaches for the last month and half. Patient has no neurologic symptoms with it. Is not worse in the morning. Happens at any time during the day. Patient's not seen a provider about this previously.  Past Medical History:  Diagnosis Date  . Hypertension     There are no active problems to display for this patient.   Past Surgical History:  Procedure Laterality Date  . CESAREAN SECTION      OB History    No data available       Home Medications    Prior to Admission medications   Medication Sig Start Date End Date Taking? Authorizing Provider  hydrochlorothiazide (HYDRODIURIL) 25 MG tablet Take 25 mg by mouth daily.   Yes [provider]    Family History No family history on file.  Social History Social History  Substance Use Topics  . Smoking status: Never Smoker  . Smokeless tobacco: Never Used  . Alcohol use No     Allergies   Penicillins   Review of Systems Review of Systems  Constitutional: Negative for fatigue and fever.  HENT: Negative for congestion.   Genitourinary: Negative for dysuria.  Neurological: Positive for headaches. Negative for dizziness, syncope, speech difficulty, weakness, light-headedness and numbness.  All other systems reviewed and are negative.    Physical Exam Updated Vital Signs BP (!) 148/92   Pulse 70   Temp 98.4 F (36.9 C) (Oral)   Resp 20   Ht 5\' 3"  (1.6 m)   Wt 81.6 kg (180 lb)   LMP 01/09/2017 (Exact Date)   SpO2 98%   BMI 31.89 kg/m   Physical Exam  Constitutional: She is oriented to person, place, and time. She appears well-developed and well-nourished.  HENT:  Head: Normocephalic and  atraumatic.  Eyes: Right eye exhibits no discharge.  Cardiovascular: Normal rate.   Pulmonary/Chest: Effort normal.  Neurological: She is oriented to person, place, and time.  Equal strength bilaterally upper and lower extremities negative pronator drift. Normal sensation bilaterally. Speech comprehensible, no slurring. Facial nerve tested and appears grossly normal. Alert and oriented 3.   Skin: Skin is warm and dry. She is not diaphoretic.  Psychiatric: She has a normal mood and affect.  Nursing note and vitals reviewed.    ED Treatments / Results  Labs (all labs ordered are listed, but only abnormal results are displayed) Labs Reviewed  PREGNANCY, URINE    EKG  EKG Interpretation None       Radiology Ct Head Wo Contrast  Result Date: 01/12/2017 CLINICAL DATA:  51 year old female with history of ongoing headaches for 1 month. Some associated dizziness and nausea. EXAM: CT HEAD WITHOUT CONTRAST TECHNIQUE: Contiguous axial images were obtained from the base of the skull through the vertex without intravenous contrast. COMPARISON:  Head CT 08/06/2016. FINDINGS: Brain: No evidence of acute infarction, hemorrhage, hydrocephalus, extra-axial collection or mass lesion/mass effect. Vascular: No hyperdense vessel or unexpected calcification. Skull: Normal. Negative for fracture or focal lesion. Sinuses/Orbits: No acute finding. Other: None. IMPRESSION: 1. No acute intracranial abnormalities. The appearance of the brain is normal. Electronically Signed   By: Brayton Mars.D.  On: 01/12/2017 13:20    Procedures Procedures (including critical care time)  Medications Ordered in ED Medications  sodium chloride 0.9 % bolus 1,000 mL (0 mLs Intravenous Stopped 01/12/17 1452)  prochlorperazine (COMPAZINE) injection 10 mg (10 mg Intravenous Given 01/12/17 1245)  diphenhydrAMINE (BENADRYL) injection 25 mg (25 mg Intravenous Given 01/12/17 1245)  ketorolac (TORADOL) 30 MG/ML injection 15 mg  (15 mg Intravenous Given 01/12/17 1245)     Initial Impression / Assessment and Plan / ED Course  I have reviewed the triage vital signs and the nursing notes.  Pertinent labs & imaging results that were available during my care of the patient were reviewed by me and considered in my medical decision making (see chart for details).     Very well-appearing 51 year old female presenting with headache. This been going on for greater than 6 weeks. Do not suspect any subarachnoid hemorrhage given length of symptoms. Patient has not had headaches that worse in the morning. Therefore less suspicious for neoplasm. We'll do CT.  3:16 PM CT negative. Patient improved with migraine cocktail. We'll have patient follow-up with a neurologist as needed for further headaches. Patient informed that she may need further imaging such as MRI in the future. Neurologic exam as well as vitals are reassuring.  Final Clinical Impressions(s) / ED Diagnoses   Final diagnoses:  Migraine without aura and without status migrainosus, not intractable    New Prescriptions New Prescriptions   No medications on file     Abelino DerrickMackuen, Reiley Bertagnolli Lyn, MD 01/12/17 1516

## 2017-01-12 NOTE — ED Notes (Signed)
Pt given water to drink. States she feels much better but very sleepy now.
